# Patient Record
Sex: Male | Born: 1975 | Race: White | Hispanic: No | Marital: Married | Smoking: Current every day smoker
Health system: Southern US, Community
[De-identification: ages and names within clinical notes are randomized; demographics above are authoritative.]

## PROBLEM LIST (undated history)

## (undated) DIAGNOSIS — I10 Essential (primary) hypertension: Secondary | ICD-10-CM

## (undated) HISTORY — PX: TONSILLECTOMY: SUR1361

## (undated) HISTORY — PX: HERNIA REPAIR: SHX51

---

## 1997-05-21 ENCOUNTER — Emergency Department (HOSPITAL_COMMUNITY): Admission: EM | Admit: 1997-05-21 | Discharge: 1997-05-21 | Payer: Self-pay | Admitting: Emergency Medicine

## 2001-12-08 ENCOUNTER — Emergency Department (HOSPITAL_COMMUNITY): Admission: EM | Admit: 2001-12-08 | Discharge: 2001-12-08 | Payer: Self-pay | Admitting: Emergency Medicine

## 2001-12-08 ENCOUNTER — Encounter: Payer: Self-pay | Admitting: Emergency Medicine

## 2002-03-12 ENCOUNTER — Emergency Department (HOSPITAL_COMMUNITY): Admission: EM | Admit: 2002-03-12 | Discharge: 2002-03-12 | Payer: Self-pay | Admitting: Emergency Medicine

## 2005-02-11 ENCOUNTER — Emergency Department (HOSPITAL_COMMUNITY): Admission: EM | Admit: 2005-02-11 | Discharge: 2005-02-11 | Payer: Self-pay | Admitting: Emergency Medicine

## 2005-04-24 ENCOUNTER — Emergency Department (HOSPITAL_COMMUNITY): Admission: EM | Admit: 2005-04-24 | Discharge: 2005-04-24 | Payer: Self-pay | Admitting: Emergency Medicine

## 2005-11-12 ENCOUNTER — Emergency Department: Payer: Self-pay

## 2005-12-30 ENCOUNTER — Emergency Department: Payer: Self-pay | Admitting: Emergency Medicine

## 2006-03-08 ENCOUNTER — Emergency Department: Payer: Self-pay

## 2006-05-27 ENCOUNTER — Emergency Department: Payer: Self-pay | Admitting: Emergency Medicine

## 2006-07-25 ENCOUNTER — Emergency Department: Payer: Self-pay | Admitting: Emergency Medicine

## 2006-10-14 ENCOUNTER — Emergency Department: Payer: Self-pay | Admitting: Emergency Medicine

## 2006-11-17 ENCOUNTER — Emergency Department (HOSPITAL_COMMUNITY): Admission: EM | Admit: 2006-11-17 | Discharge: 2006-11-17 | Payer: Self-pay | Admitting: Emergency Medicine

## 2007-01-25 ENCOUNTER — Emergency Department (HOSPITAL_COMMUNITY): Admission: EM | Admit: 2007-01-25 | Discharge: 2007-01-25 | Payer: Self-pay | Admitting: Emergency Medicine

## 2007-01-31 ENCOUNTER — Emergency Department (HOSPITAL_COMMUNITY): Admission: EM | Admit: 2007-01-31 | Discharge: 2007-01-31 | Payer: Self-pay | Admitting: Emergency Medicine

## 2007-04-29 ENCOUNTER — Ambulatory Visit: Payer: Self-pay | Admitting: Orthopedic Surgery

## 2011-02-28 ENCOUNTER — Ambulatory Visit: Payer: Self-pay | Admitting: Emergency Medicine

## 2014-09-10 ENCOUNTER — Emergency Department (HOSPITAL_COMMUNITY): Payer: Medicaid Other

## 2014-09-10 ENCOUNTER — Emergency Department (HOSPITAL_COMMUNITY)
Admission: EM | Admit: 2014-09-10 | Discharge: 2014-09-10 | Disposition: A | Discharge status: Home / Self Care | Payer: Medicaid Other | Attending: Emergency Medicine | Admitting: Emergency Medicine

## 2014-09-10 ENCOUNTER — Encounter (HOSPITAL_COMMUNITY): Payer: Self-pay | Admitting: *Deleted

## 2014-09-10 DIAGNOSIS — Y998 Other external cause status: Secondary | ICD-10-CM | POA: Insufficient documentation

## 2014-09-10 DIAGNOSIS — Z7951 Long term (current) use of inhaled steroids: Secondary | ICD-10-CM | POA: Diagnosis not present

## 2014-09-10 DIAGNOSIS — Z79899 Other long term (current) drug therapy: Secondary | ICD-10-CM | POA: Diagnosis not present

## 2014-09-10 DIAGNOSIS — S80811A Abrasion, right lower leg, initial encounter: Secondary | ICD-10-CM | POA: Diagnosis not present

## 2014-09-10 DIAGNOSIS — S92101A Unspecified fracture of right talus, initial encounter for closed fracture: Secondary | ICD-10-CM | POA: Diagnosis not present

## 2014-09-10 DIAGNOSIS — Z720 Tobacco use: Secondary | ICD-10-CM | POA: Diagnosis not present

## 2014-09-10 DIAGNOSIS — Y9389 Activity, other specified: Secondary | ICD-10-CM | POA: Diagnosis not present

## 2014-09-10 DIAGNOSIS — Y9289 Other specified places as the place of occurrence of the external cause: Secondary | ICD-10-CM | POA: Diagnosis not present

## 2014-09-10 DIAGNOSIS — I10 Essential (primary) hypertension: Secondary | ICD-10-CM | POA: Diagnosis not present

## 2014-09-10 DIAGNOSIS — W19XXXA Unspecified fall, initial encounter: Secondary | ICD-10-CM

## 2014-09-10 DIAGNOSIS — S63501A Unspecified sprain of right wrist, initial encounter: Secondary | ICD-10-CM | POA: Insufficient documentation

## 2014-09-10 DIAGNOSIS — S43401A Unspecified sprain of right shoulder joint, initial encounter: Secondary | ICD-10-CM | POA: Insufficient documentation

## 2014-09-10 DIAGNOSIS — W11XXXA Fall on and from ladder, initial encounter: Secondary | ICD-10-CM | POA: Insufficient documentation

## 2014-09-10 DIAGNOSIS — S4991XA Unspecified injury of right shoulder and upper arm, initial encounter: Secondary | ICD-10-CM | POA: Diagnosis present

## 2014-09-10 HISTORY — DX: Essential (primary) hypertension: I10

## 2014-09-10 LAB — COMPREHENSIVE METABOLIC PANEL
ALT: 28 U/L (ref 17–63)
AST: 34 U/L (ref 15–41)
Albumin: 4.1 g/dL (ref 3.5–5.0)
Alkaline Phosphatase: 47 U/L (ref 38–126)
Anion gap: 10 (ref 5–15)
BUN: 15 mg/dL (ref 6–20)
CO2: 23 mmol/L (ref 22–32)
Calcium: 9.3 mg/dL (ref 8.9–10.3)
Chloride: 104 mmol/L (ref 101–111)
Creatinine, Ser: 1.03 mg/dL (ref 0.61–1.24)
GFR calc Af Amer: >60 mL/min (ref >60)
GFR calc non Af Amer: >60 mL/min (ref >60)
Glucose, Bld: 118 mg/dL — ABNORMAL HIGH (ref 65–99)
Potassium: 3.9 mmol/L (ref 3.5–5.1)
Sodium: 137 mmol/L (ref 135–145)
Total Bilirubin: 0.7 mg/dL (ref 0.3–1.2)
Total Protein: 6.6 g/dL (ref 6.5–8.1)

## 2014-09-10 LAB — CBC WITH DIFFERENTIAL/PLATELET
Basophils Absolute: 0.1 10*3/uL (ref 0.0–0.1)
Basophils Relative: 1 % (ref 0–1)
Eosinophils Absolute: 0.2 10*3/uL (ref 0.0–0.7)
Eosinophils Relative: 2 % (ref 0–5)
HCT: 41.5 % (ref 39.0–52.0)
Hemoglobin: 14.4 g/dL (ref 13.0–17.0)
Lymphocytes Relative: 28 % (ref 12–46)
Lymphs Abs: 2.9 10*3/uL (ref 0.7–4.0)
MCH: 28.9 pg (ref 26.0–34.0)
MCHC: 34.7 g/dL (ref 30.0–36.0)
MCV: 83.2 fL (ref 78.0–100.0)
Monocytes Absolute: 0.8 10*3/uL (ref 0.1–1.0)
Monocytes Relative: 7 % (ref 3–12)
Neutro Abs: 6.3 10*3/uL (ref 1.7–7.7)
Neutrophils Relative %: 62 % (ref 43–77)
Platelets: 335 10*3/uL (ref 150–400)
RBC: 4.99 MIL/uL (ref 4.22–5.81)
RDW: 13.9 % (ref 11.5–15.5)
WBC: 10.2 10*3/uL (ref 4.0–10.5)

## 2014-09-10 MED ORDER — MORPHINE SULFATE (PF) 4 MG/ML IV SOLN
4.0000 mg | Freq: Once | INTRAVENOUS | Status: AC
  Administered 2014-09-10: 4 mg via INTRAVENOUS
  Filled 2014-09-10: qty 1

## 2014-09-10 MED ORDER — IOHEXOL 300 MG/ML  SOLN
100.0000 mL | Freq: Once | INTRAMUSCULAR | Status: AC | PRN
  Administered 2014-09-10: 100 mL via INTRAVENOUS

## 2014-09-10 MED ORDER — OXYCODONE-ACETAMINOPHEN 5-325 MG PO TABS: 1.0000 | ORAL_TABLET | Freq: Four times a day (QID) | ORAL | Status: DC | PRN

## 2014-09-10 MED ORDER — HYDROMORPHONE HCL 1 MG/ML IJ SOLN
1.0000 mg | INTRAMUSCULAR | Status: AC | PRN
  Administered 2014-09-10 (×2): 1 mg via INTRAVENOUS
  Filled 2014-09-10 (×2): qty 1

## 2014-09-10 NOTE — Progress Notes (Signed)
Patient ID: Roberto Mckay, male   DOB: Mar 16, 1975, 39 y.o.   MRN: [id number] ED MD paged me about pt.   ED MD currently coding another pt.    [name] has essentially nondisplaced talus Fx.  In splint. Needs to be NWB and followup in office with Dr. Ophelia Charter in one week . Needs to keep foot above heart level at home so swelling will go down and he can then be placed in cast.    My phone [phone]

## 2014-09-10 NOTE — ED Notes (Signed)
Pt remains in radiology 

## 2014-09-10 NOTE — ED Notes (Signed)
Ice packs applied to  Rt ankle and shoulder
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
## 2014-09-10 NOTE — ED Notes (Signed)
MD at bedside. 
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
## 2014-09-10 NOTE — Progress Notes (Signed)
Orthopedic Tech Progress Note Patient Details:  WELLES WALTHALL Mar 07, 1975 [id number]  Ortho Devices Type of Ortho Device: Ace wrap, Crutches, Post (short leg) splint Ortho Device/Splint Location: RLE Ortho Device/Splint Interventions: Ordered, Application   Jennye Moccasin 09/10/2014, 7:17 PM

## 2014-09-10 NOTE — ED Notes (Signed)
Patient asking for me pain medication at this time.  MD notified.  Ankle elevated and ice placed.

## 2014-09-10 NOTE — ED Notes (Signed)
Pt given ice chips per dr Lynelle Doctor

## 2014-09-10 NOTE — ED Notes (Signed)
Md at bedside

## 2014-09-10 NOTE — ED Notes (Signed)
Ortho at bedside.

## 2014-09-10 NOTE — ED Provider Notes (Signed)
MSE was initiated and I personally evaluated the patient and placed orders (if any) at  2:41 PM on September 10, 2014.  The patient appears stable so that the remainder of the MSE may be completed by another provider.  Patient here with fall from 20 foot ladder. Fell on R side. No head injury. Twisted R ankle, landed on R shoulder. On exam, uncomfortable. Atraumatic head. Minimal bruising R shoulder nl able to range it. Bilateral breath sounds. No ecchymosis or abdomen or chest. Minimal lumbar tenderness, no step off. Pelvis stable. Minimal R distal femur, tibial tenderness, obvious R ankle swelling with tenderness. No obvious open fracture. Will get trauma labs, xrays. No signs of head or chest or abdominal injuries. No open fracture to activate code trauma. Will give pain meds as well.   Richardean Canal, MD 09/10/14 [phone]

## 2016-02-07 ENCOUNTER — Other Ambulatory Visit: Payer: Self-pay | Admitting: Internal Medicine

## 2016-02-07 DIAGNOSIS — M7532 Calcific tendinitis of left shoulder: Secondary | ICD-10-CM

## 2016-02-17 ENCOUNTER — Ambulatory Visit
Admission: RE | Admit: 2016-02-17 | Discharge: 2016-02-17 | Disposition: A | Discharge status: Home / Self Care | Payer: BLUE CROSS/BLUE SHIELD | Source: Ambulatory Visit | Attending: Internal Medicine | Admitting: Internal Medicine

## 2016-02-17 DIAGNOSIS — M19012 Primary osteoarthritis, left shoulder: Secondary | ICD-10-CM | POA: Diagnosis not present

## 2016-02-17 DIAGNOSIS — M7532 Calcific tendinitis of left shoulder: Secondary | ICD-10-CM
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
## 2016-09-07 IMAGING — CT CT CERVICAL SPINE W/O CM
3 of 4 series · 13 of 33 positions shown, 16 images · non-contrast
Comparison: None.

CLINICAL DATA: Patient fell from a 20 foot ladder. Fell on the
right side. No head injury. Bruising of the right shoulder.

EXAM:
CT CERVICAL SPINE WITHOUT CONTRAST
TECHNIQUE: Multidetector CT imaging of the cervical spine was performed without
intravenous contrast. Multiplanar CT image reconstructions were also
generated.

[Series 3: c_spine 2.0 i30s 3 · axial · 0.29mm/px · z∈[-273,-129]mm · 5 of 109 slices shown, 7 images]
[im 19/109  soft-tissue]
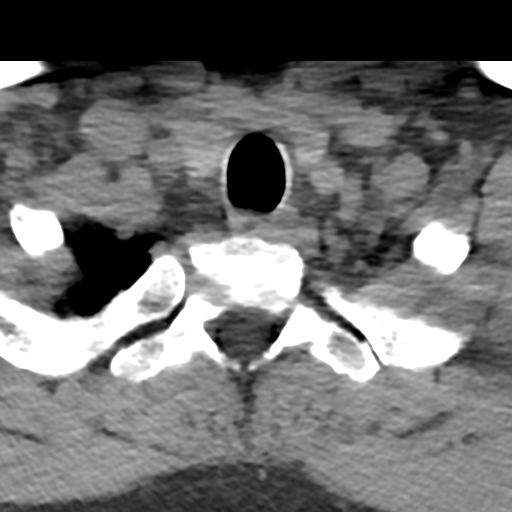
[im 19/109  bone]
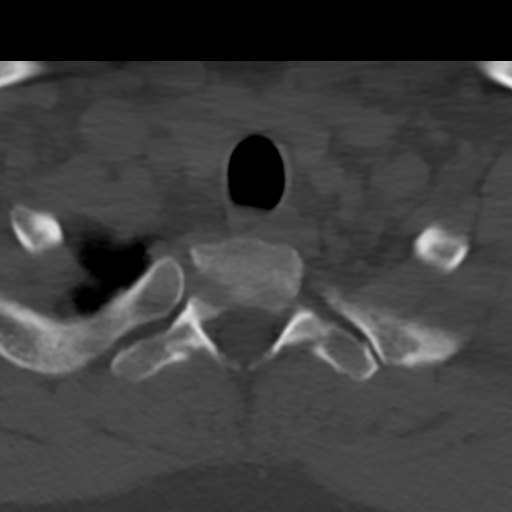
[im 37/109  bone]
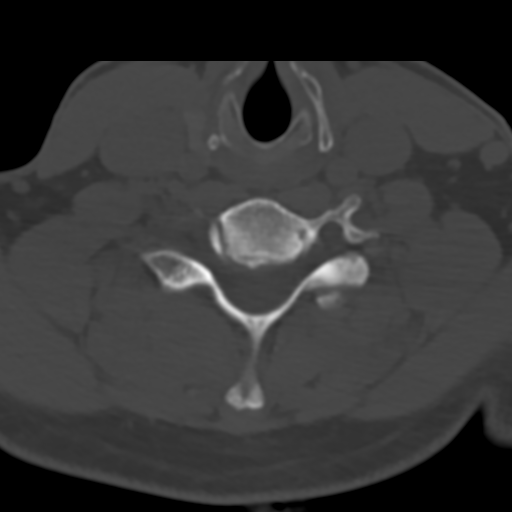
[im 55/109  bone]
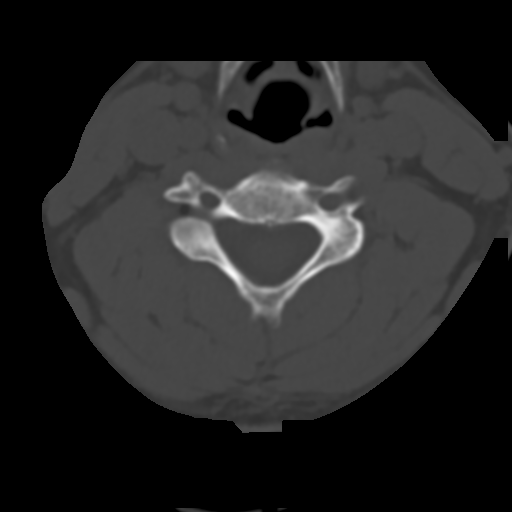
[im 73/109  bone]
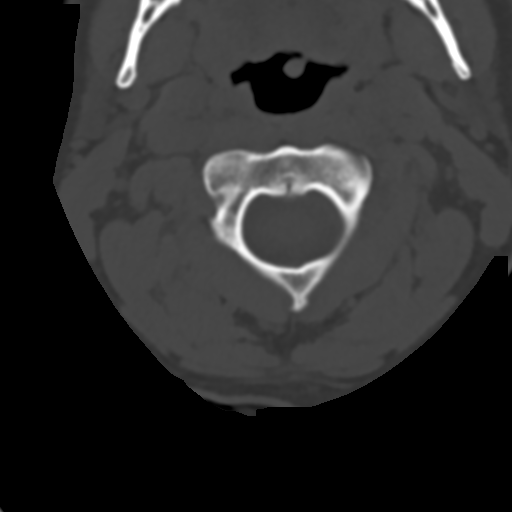
[im 91/109  soft-tissue]
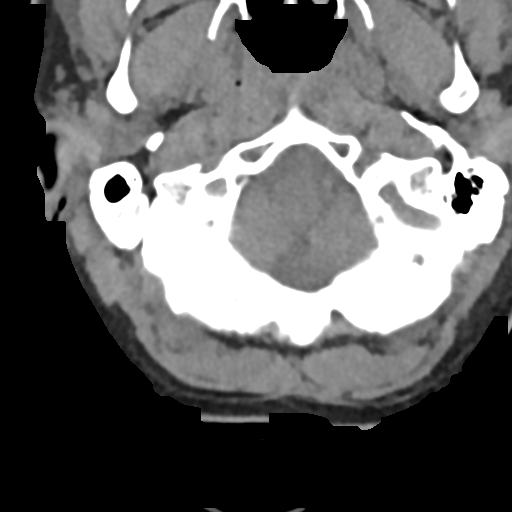
[im 91/109  bone]
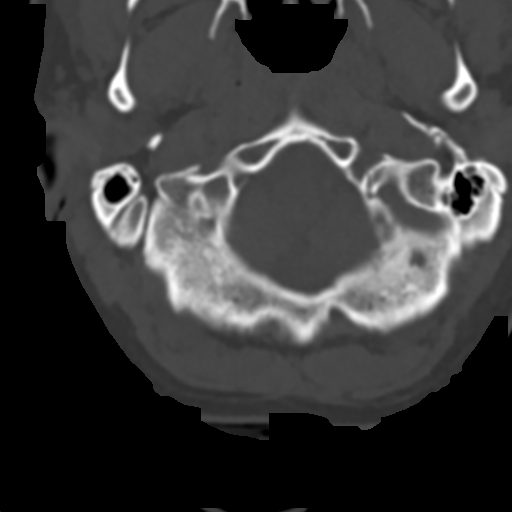

[Series 4: coronal bone · coronal · 0.32mm/px · 3 of 43 slices shown]
[im 9/43  bone]
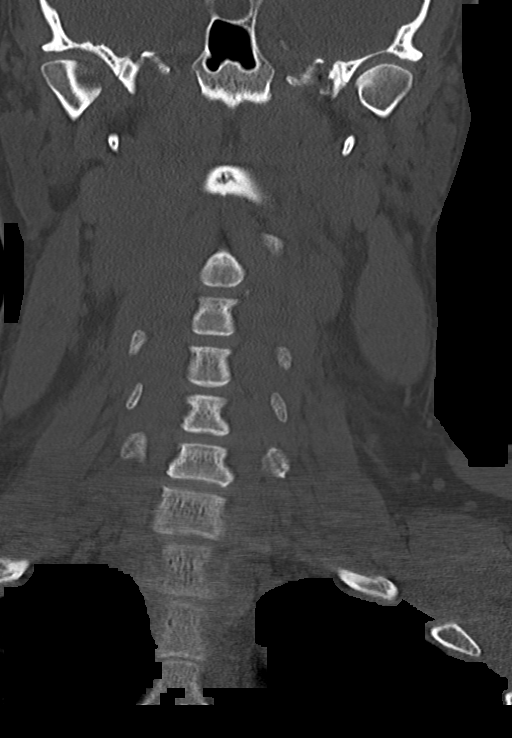
[im 17/43  bone]
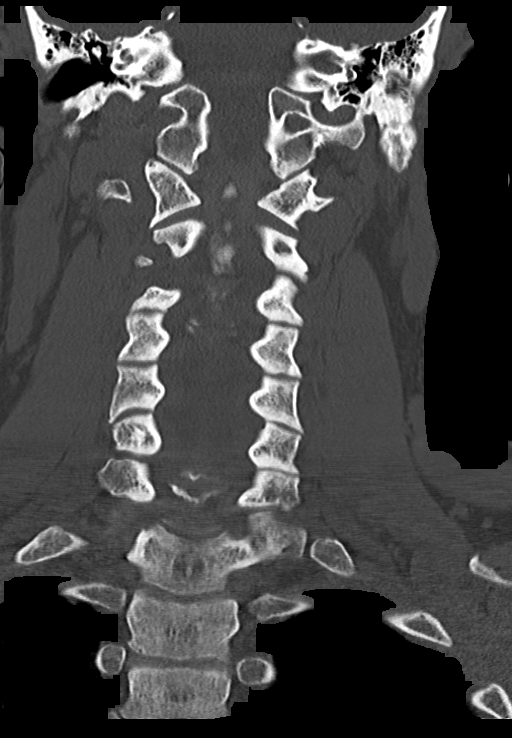
[im 26/43  bone]
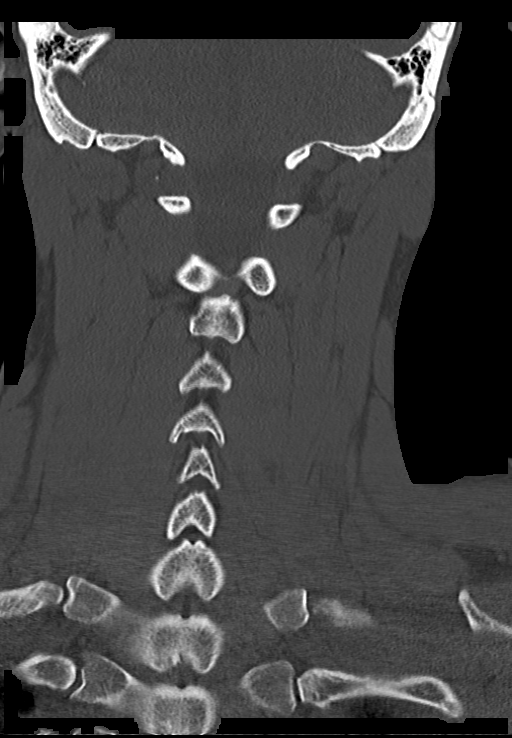

[Series 5: sagittal bone · sagittal · 0.28mm/px · 5 of 48 slices shown, 6 images]
[im 16/48  bone]
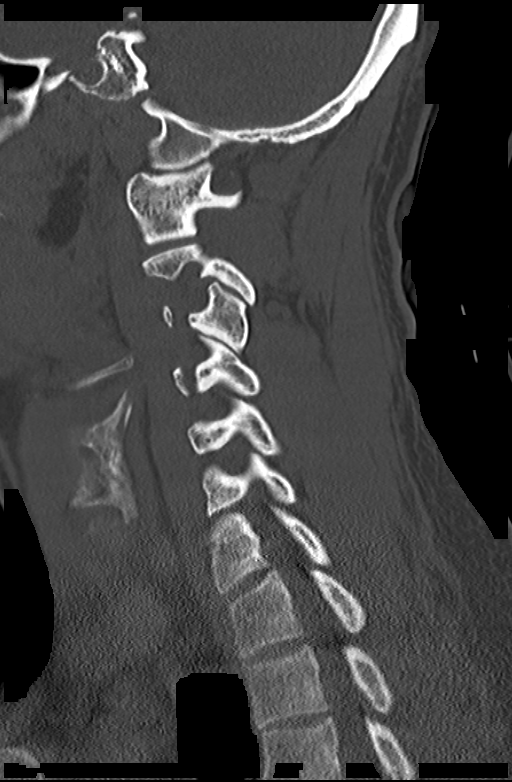
[im 20/48  bone]
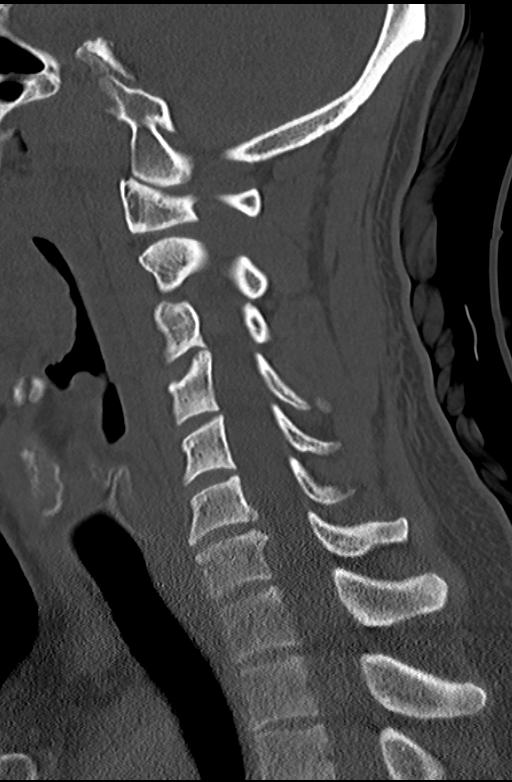
[im 24/48  soft-tissue]
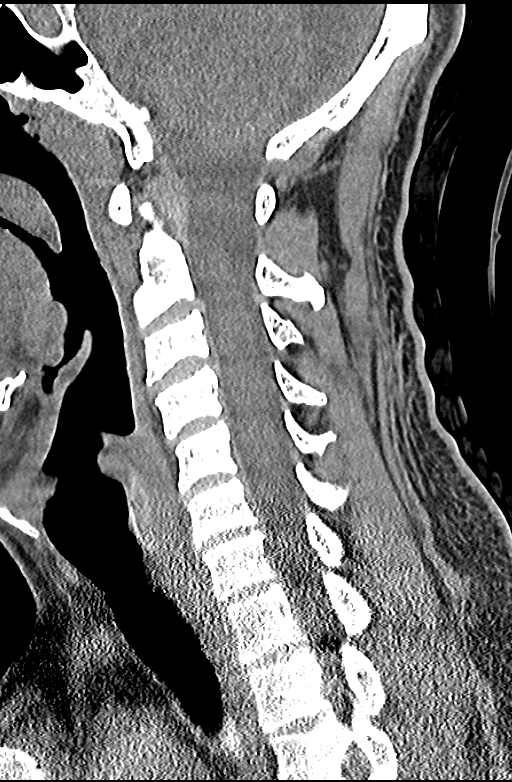
[im 24/48  bone]
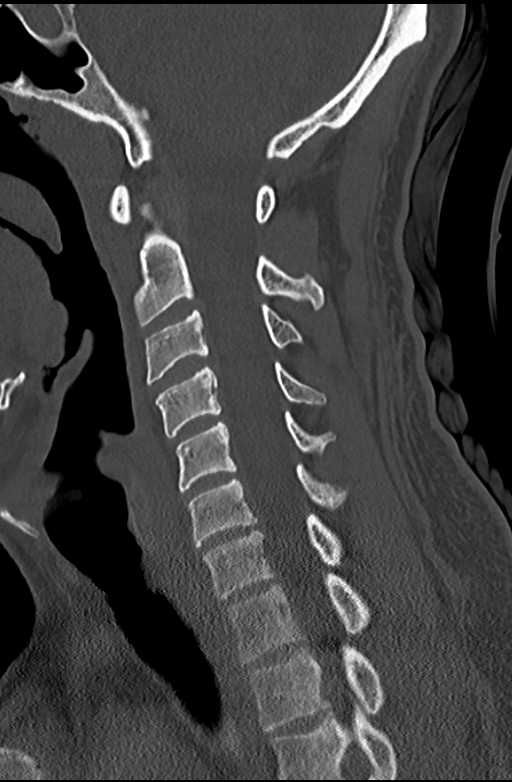
[im 28/48  bone]
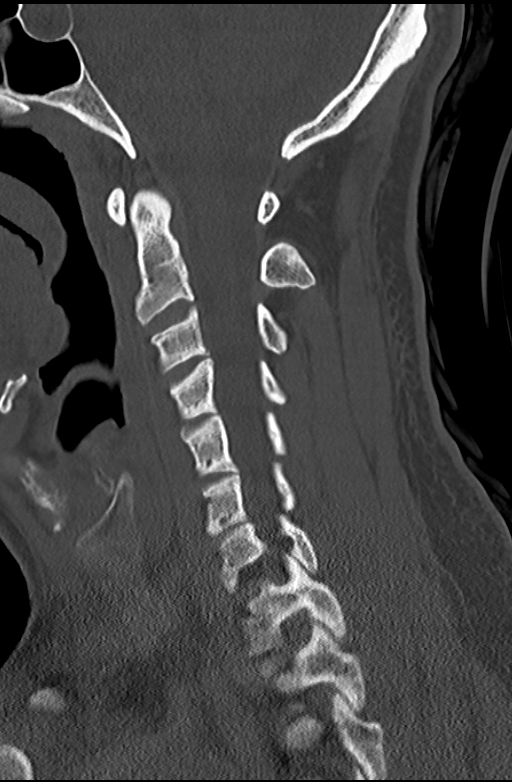
[im 32/48  bone]
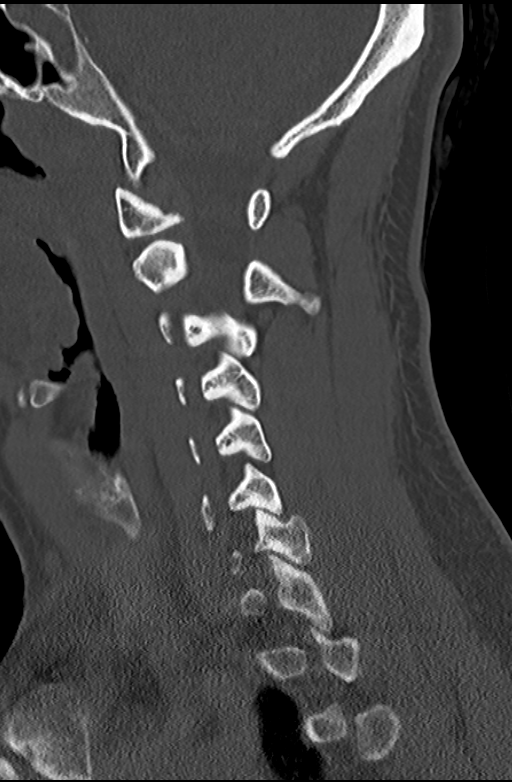

[13 of 33 positions shown; findings below may reference images not displayed]

FINDINGS: There is normal alignment of the cervical spine. There is no
evidence for acute fracture or dislocation. Prevertebral soft
tissues have a normal appearance. Lung apices have a normal
appearance. Lung apices are unremarkable. Soft tissues are normal in
appearance.
IMPRESSION: No evidence for acute cervical spine abnormality.

## 2017-06-30 ENCOUNTER — Other Ambulatory Visit: Payer: Self-pay

## 2017-06-30 ENCOUNTER — Emergency Department
Admission: EM | Admit: 2017-06-30 | Discharge: 2017-06-30 | Disposition: A | Discharge status: Home / Self Care | Payer: Self-pay | Attending: Emergency Medicine | Admitting: Emergency Medicine

## 2017-06-30 ENCOUNTER — Emergency Department: Payer: Self-pay

## 2017-06-30 DIAGNOSIS — S060X1A Concussion with loss of consciousness of 30 minutes or less, initial encounter: Secondary | ICD-10-CM | POA: Insufficient documentation

## 2017-06-30 DIAGNOSIS — W1789XA Other fall from one level to another, initial encounter: Secondary | ICD-10-CM | POA: Insufficient documentation

## 2017-06-30 DIAGNOSIS — Z23 Encounter for immunization: Secondary | ICD-10-CM | POA: Insufficient documentation

## 2017-06-30 DIAGNOSIS — I10 Essential (primary) hypertension: Secondary | ICD-10-CM | POA: Insufficient documentation

## 2017-06-30 DIAGNOSIS — Y9389 Activity, other specified: Secondary | ICD-10-CM | POA: Insufficient documentation

## 2017-06-30 DIAGNOSIS — Z79899 Other long term (current) drug therapy: Secondary | ICD-10-CM | POA: Insufficient documentation

## 2017-06-30 DIAGNOSIS — Y999 Unspecified external cause status: Secondary | ICD-10-CM | POA: Insufficient documentation

## 2017-06-30 DIAGNOSIS — S0083XA Contusion of other part of head, initial encounter: Secondary | ICD-10-CM | POA: Insufficient documentation

## 2017-06-30 DIAGNOSIS — Y9289 Other specified places as the place of occurrence of the external cause: Secondary | ICD-10-CM | POA: Insufficient documentation

## 2017-06-30 DIAGNOSIS — F17210 Nicotine dependence, cigarettes, uncomplicated: Secondary | ICD-10-CM | POA: Insufficient documentation

## 2017-06-30 MED ORDER — TETANUS-DIPHTH-ACELL PERTUSSIS 5-2.5-18.5 LF-MCG/0.5 IM SUSP
INTRAMUSCULAR | Status: AC
  Filled 2017-06-30: qty 0.5

## 2017-06-30 MED ORDER — TETANUS-DIPHTH-ACELL PERTUSSIS 5-2.5-18.5 LF-MCG/0.5 IM SUSP
0.5000 mL | Freq: Once | INTRAMUSCULAR | Status: AC
  Administered 2017-06-30: 0.5 mL via INTRAMUSCULAR
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
## 2017-06-30 NOTE — ED Triage Notes (Signed)
Pt arrives with c/o fall on Wednesday from tractor. States hit head, face. States nausea. States dizzy.   Mom states that pt has problem with brain where one side isn't developed.

## 2018-07-29 ENCOUNTER — Encounter: Payer: Self-pay | Admitting: Emergency Medicine

## 2018-07-29 ENCOUNTER — Emergency Department
Admission: EM | Admit: 2018-07-29 | Discharge: 2018-07-29 | Disposition: A | Discharge status: Home / Self Care | Payer: No Typology Code available for payment source | Attending: Emergency Medicine | Admitting: Emergency Medicine

## 2018-07-29 ENCOUNTER — Other Ambulatory Visit: Payer: Self-pay

## 2018-07-29 ENCOUNTER — Emergency Department: Payer: No Typology Code available for payment source

## 2018-07-29 DIAGNOSIS — Z79899 Other long term (current) drug therapy: Secondary | ICD-10-CM | POA: Diagnosis not present

## 2018-07-29 DIAGNOSIS — S199XXA Unspecified injury of neck, initial encounter: Secondary | ICD-10-CM | POA: Diagnosis present

## 2018-07-29 DIAGNOSIS — I10 Essential (primary) hypertension: Secondary | ICD-10-CM | POA: Diagnosis not present

## 2018-07-29 DIAGNOSIS — S20211A Contusion of right front wall of thorax, initial encounter: Secondary | ICD-10-CM | POA: Diagnosis not present

## 2018-07-29 DIAGNOSIS — Y92410 Unspecified street and highway as the place of occurrence of the external cause: Secondary | ICD-10-CM | POA: Insufficient documentation

## 2018-07-29 DIAGNOSIS — Y9389 Activity, other specified: Secondary | ICD-10-CM | POA: Insufficient documentation

## 2018-07-29 DIAGNOSIS — M25511 Pain in right shoulder: Secondary | ICD-10-CM | POA: Diagnosis not present

## 2018-07-29 DIAGNOSIS — Y998 Other external cause status: Secondary | ICD-10-CM | POA: Diagnosis not present

## 2018-07-29 DIAGNOSIS — S161XXA Strain of muscle, fascia and tendon at neck level, initial encounter: Secondary | ICD-10-CM | POA: Diagnosis not present

## 2018-07-29 DIAGNOSIS — F17210 Nicotine dependence, cigarettes, uncomplicated: Secondary | ICD-10-CM | POA: Insufficient documentation

## 2018-07-29 MED ORDER — MELOXICAM 7.5 MG PO TABS
15.0000 mg | ORAL_TABLET | Freq: Once | ORAL | Status: AC
  Administered 2018-07-29: 15 mg via ORAL
  Filled 2018-07-29: qty 2

## 2018-07-29 MED ORDER — OXYCODONE-ACETAMINOPHEN 5-325 MG PO TABS
1.0000 | ORAL_TABLET | Freq: Once | ORAL | Status: AC
  Administered 2018-07-29: 1 via ORAL
  Filled 2018-07-29: qty 1

## 2018-07-29 MED ORDER — METHOCARBAMOL 500 MG PO TABS: 500.0000 mg | ORAL_TABLET | Freq: Four times a day (QID) | ORAL | 0 refills | Status: AC

## 2018-07-29 MED ORDER — MELOXICAM 15 MG PO TABS: 15.0000 mg | ORAL_TABLET | Freq: Every day | ORAL | 0 refills | Status: AC

## 2018-07-29 MED ORDER — METHOCARBAMOL 500 MG PO TABS
1000.0000 mg | ORAL_TABLET | Freq: Once | ORAL | Status: AC
  Administered 2018-07-29: 17:00:00 1000 mg via ORAL
  Filled 2018-07-29: qty 2
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
## 2018-11-07 ENCOUNTER — Other Ambulatory Visit: Payer: Self-pay

## 2018-11-07 DIAGNOSIS — Z20822 Contact with and (suspected) exposure to covid-19: Secondary | ICD-10-CM

## 2018-11-08 LAB — NOVEL CORONAVIRUS, NAA: SARS-CoV-2, NAA: NOT DETECTED

## 2018-12-05 ENCOUNTER — Other Ambulatory Visit: Payer: Self-pay

## 2018-12-05 DIAGNOSIS — Z20822 Contact with and (suspected) exposure to covid-19: Secondary | ICD-10-CM

## 2018-12-06 LAB — NOVEL CORONAVIRUS, NAA: SARS-CoV-2, NAA: NOT DETECTED

## 2019-11-04 ENCOUNTER — Other Ambulatory Visit: Payer: Self-pay

## 2019-11-04 ENCOUNTER — Ambulatory Visit (INDEPENDENT_AMBULATORY_CARE_PROVIDER_SITE_OTHER): Payer: No Payment, Other | Admitting: Licensed Clinical Social Worker

## 2019-11-04 ENCOUNTER — Encounter (HOSPITAL_COMMUNITY): Payer: Self-pay | Admitting: Licensed Clinical Social Worker

## 2019-11-04 DIAGNOSIS — F411 Generalized anxiety disorder: Secondary | ICD-10-CM | POA: Insufficient documentation

## 2019-11-04 DIAGNOSIS — F331 Major depressive disorder, recurrent, moderate: Secondary | ICD-10-CM
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
## 2019-11-24 ENCOUNTER — Telehealth (INDEPENDENT_AMBULATORY_CARE_PROVIDER_SITE_OTHER): Payer: No Payment, Other | Admitting: Physician Assistant

## 2019-11-24 ENCOUNTER — Ambulatory Visit (HOSPITAL_COMMUNITY): Payer: No Payment, Other | Admitting: Psychiatry

## 2019-11-24 ENCOUNTER — Encounter (HOSPITAL_COMMUNITY): Payer: Self-pay | Admitting: Psychiatry

## 2019-11-24 ENCOUNTER — Other Ambulatory Visit: Payer: Self-pay

## 2019-11-24 DIAGNOSIS — F331 Major depressive disorder, recurrent, moderate: Secondary | ICD-10-CM

## 2019-11-24 DIAGNOSIS — F411 Generalized anxiety disorder: Secondary | ICD-10-CM

## 2019-11-27 ENCOUNTER — Encounter (HOSPITAL_COMMUNITY): Payer: Self-pay | Admitting: Physician Assistant
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
## 2019-12-24 ENCOUNTER — Ambulatory Visit (HOSPITAL_COMMUNITY): Payer: No Payment, Other | Admitting: Licensed Clinical Social Worker

## 2019-12-24 ENCOUNTER — Other Ambulatory Visit: Payer: Self-pay

## 2019-12-24 ENCOUNTER — Telehealth (HOSPITAL_COMMUNITY): Payer: Self-pay | Admitting: Licensed Clinical Social Worker

## 2019-12-24 NOTE — Telephone Encounter (Signed)
Pt was sent three links via text message provided with phone number provided in epic with no response. LCSW called pt phone with no answer and LCSW left HIPAA compliant VM. Pt will be marked as a no call/show, he will need to reschedule his appoint by calling [phone]

## 2020-01-05 ENCOUNTER — Telehealth (HOSPITAL_COMMUNITY): Payer: No Payment, Other | Admitting: Physician Assistant

## 2020-01-05 ENCOUNTER — Other Ambulatory Visit: Payer: Self-pay

## 2020-01-07 ENCOUNTER — Ambulatory Visit (HOSPITAL_COMMUNITY): Payer: Self-pay | Admitting: Licensed Clinical Social Worker

## 2022-02-13 ENCOUNTER — Telehealth: Payer: Self-pay

## 2022-02-13 NOTE — Telephone Encounter (Signed)
Mychart msg sent. AS, CMA

## 2022-02-22 ENCOUNTER — Ambulatory Visit (INDEPENDENT_AMBULATORY_CARE_PROVIDER_SITE_OTHER): Payer: Medicaid Other | Admitting: Primary Care
